# Patient Record
Sex: Female | Born: 1945 | Race: White | Hispanic: No | Smoking: Never smoker
Health system: Southern US, Community
[De-identification: ages and names within clinical notes are randomized; demographics above are authoritative.]

## PROBLEM LIST (undated history)

## (undated) DIAGNOSIS — I10 Essential (primary) hypertension: Secondary | ICD-10-CM

---

## 1999-08-23 ENCOUNTER — Other Ambulatory Visit: Admission: RE | Admit: 1999-08-23 | Discharge: 1999-08-23 | Payer: Self-pay | Admitting: Obstetrics and Gynecology

## 2000-08-10 ENCOUNTER — Other Ambulatory Visit: Admission: RE | Admit: 2000-08-10 | Discharge: 2000-08-10 | Payer: Self-pay | Admitting: Obstetrics and Gynecology

## 2001-08-31 ENCOUNTER — Other Ambulatory Visit: Admission: RE | Admit: 2001-08-31 | Discharge: 2001-08-31 | Payer: Self-pay | Admitting: Obstetrics and Gynecology

## 2002-09-20 ENCOUNTER — Other Ambulatory Visit: Admission: RE | Admit: 2002-09-20 | Discharge: 2002-09-20 | Payer: Self-pay | Admitting: Obstetrics and Gynecology

## 2003-11-03 ENCOUNTER — Other Ambulatory Visit: Admission: RE | Admit: 2003-11-03 | Discharge: 2003-11-03 | Payer: Self-pay | Admitting: Obstetrics and Gynecology

## 2004-11-25 ENCOUNTER — Other Ambulatory Visit: Admission: RE | Admit: 2004-11-25 | Discharge: 2004-11-25 | Payer: Self-pay | Admitting: Obstetrics and Gynecology

## 2006-01-26 ENCOUNTER — Other Ambulatory Visit: Admission: RE | Admit: 2006-01-26 | Discharge: 2006-01-26 | Payer: Self-pay | Admitting: Obstetrics and Gynecology

## 2007-10-12 ENCOUNTER — Encounter: Admission: RE | Admit: 2007-10-12 | Discharge: 2007-10-12 | Payer: Self-pay | Admitting: Family Medicine

## 2015-05-31 ENCOUNTER — Other Ambulatory Visit (HOSPITAL_COMMUNITY): Payer: Self-pay | Admitting: Gastroenterology

## 2015-05-31 DIAGNOSIS — Z1211 Encounter for screening for malignant neoplasm of colon: Secondary | ICD-10-CM

## 2015-06-01 ENCOUNTER — Ambulatory Visit (HOSPITAL_COMMUNITY)
Admission: RE | Admit: 2015-06-01 | Discharge: 2015-06-01 | Disposition: A | Discharge status: Home / Self Care | Payer: Medicare Other | Source: Ambulatory Visit | Attending: Gastroenterology | Admitting: Gastroenterology

## 2015-06-01 DIAGNOSIS — Z1211 Encounter for screening for malignant neoplasm of colon: Secondary | ICD-10-CM | POA: Insufficient documentation

## 2015-06-01 DIAGNOSIS — K5730 Diverticulosis of large intestine without perforation or abscess without bleeding: Secondary | ICD-10-CM | POA: Diagnosis not present

## 2016-01-28 DIAGNOSIS — Z1231 Encounter for screening mammogram for malignant neoplasm of breast: Secondary | ICD-10-CM | POA: Diagnosis not present

## 2016-01-28 DIAGNOSIS — Z803 Family history of malignant neoplasm of breast: Secondary | ICD-10-CM | POA: Diagnosis not present

## 2016-01-30 DIAGNOSIS — B029 Zoster without complications: Secondary | ICD-10-CM | POA: Diagnosis not present

## 2016-02-22 DIAGNOSIS — R05 Cough: Secondary | ICD-10-CM | POA: Diagnosis not present

## 2016-06-02 DIAGNOSIS — R69 Illness, unspecified: Secondary | ICD-10-CM | POA: Diagnosis not present

## 2016-06-02 DIAGNOSIS — K219 Gastro-esophageal reflux disease without esophagitis: Secondary | ICD-10-CM | POA: Diagnosis not present

## 2016-06-02 DIAGNOSIS — I10 Essential (primary) hypertension: Secondary | ICD-10-CM | POA: Diagnosis not present

## 2016-06-02 DIAGNOSIS — E782 Mixed hyperlipidemia: Secondary | ICD-10-CM | POA: Diagnosis not present

## 2016-06-02 DIAGNOSIS — Z0000 Encounter for general adult medical examination without abnormal findings: Secondary | ICD-10-CM | POA: Diagnosis not present

## 2016-08-06 DIAGNOSIS — R69 Illness, unspecified: Secondary | ICD-10-CM | POA: Diagnosis not present

## 2016-09-08 DIAGNOSIS — H16223 Keratoconjunctivitis sicca, not specified as Sjogren's, bilateral: Secondary | ICD-10-CM | POA: Diagnosis not present

## 2016-09-08 DIAGNOSIS — H52223 Regular astigmatism, bilateral: Secondary | ICD-10-CM | POA: Diagnosis not present

## 2016-09-08 DIAGNOSIS — H524 Presbyopia: Secondary | ICD-10-CM | POA: Diagnosis not present

## 2016-09-08 DIAGNOSIS — H5203 Hypermetropia, bilateral: Secondary | ICD-10-CM | POA: Diagnosis not present

## 2016-09-08 DIAGNOSIS — H2513 Age-related nuclear cataract, bilateral: Secondary | ICD-10-CM | POA: Diagnosis not present

## 2016-09-25 DIAGNOSIS — R69 Illness, unspecified: Secondary | ICD-10-CM | POA: Diagnosis not present

## 2016-10-04 DIAGNOSIS — S066X0A Traumatic subarachnoid hemorrhage without loss of consciousness, initial encounter: Secondary | ICD-10-CM | POA: Diagnosis not present

## 2016-10-04 DIAGNOSIS — S065X0A Traumatic subdural hemorrhage without loss of consciousness, initial encounter: Secondary | ICD-10-CM | POA: Diagnosis not present

## 2016-10-05 DIAGNOSIS — S06360A Traumatic hemorrhage of cerebrum, unspecified, without loss of consciousness, initial encounter: Secondary | ICD-10-CM | POA: Diagnosis not present

## 2016-10-05 DIAGNOSIS — S0083XA Contusion of other part of head, initial encounter: Secondary | ICD-10-CM | POA: Diagnosis not present

## 2016-10-05 DIAGNOSIS — Z23 Encounter for immunization: Secondary | ICD-10-CM | POA: Diagnosis not present

## 2016-10-05 DIAGNOSIS — S065X0A Traumatic subdural hemorrhage without loss of consciousness, initial encounter: Secondary | ICD-10-CM | POA: Diagnosis not present

## 2016-10-05 DIAGNOSIS — I10 Essential (primary) hypertension: Secondary | ICD-10-CM | POA: Diagnosis not present

## 2016-10-05 DIAGNOSIS — S065X9A Traumatic subdural hemorrhage with loss of consciousness of unspecified duration, initial encounter: Secondary | ICD-10-CM | POA: Diagnosis not present

## 2016-10-05 DIAGNOSIS — R402411 Glasgow coma scale score 13-15, in the field [EMT or ambulance]: Secondary | ICD-10-CM | POA: Diagnosis not present

## 2016-10-05 DIAGNOSIS — I6200 Nontraumatic subdural hemorrhage, unspecified: Secondary | ICD-10-CM | POA: Diagnosis not present

## 2016-10-05 DIAGNOSIS — S066X0A Traumatic subarachnoid hemorrhage without loss of consciousness, initial encounter: Secondary | ICD-10-CM | POA: Diagnosis not present

## 2016-10-05 DIAGNOSIS — Z7982 Long term (current) use of aspirin: Secondary | ICD-10-CM | POA: Diagnosis not present

## 2016-10-05 DIAGNOSIS — S3993XA Unspecified injury of pelvis, initial encounter: Secondary | ICD-10-CM | POA: Diagnosis not present

## 2016-10-05 DIAGNOSIS — S065X1A Traumatic subdural hemorrhage with loss of consciousness of 30 minutes or less, initial encounter: Secondary | ICD-10-CM | POA: Diagnosis not present

## 2016-10-05 DIAGNOSIS — S01511A Laceration without foreign body of lip, initial encounter: Secondary | ICD-10-CM | POA: Diagnosis not present

## 2016-10-05 DIAGNOSIS — S066X9A Traumatic subarachnoid hemorrhage with loss of consciousness of unspecified duration, initial encounter: Secondary | ICD-10-CM | POA: Diagnosis not present

## 2016-10-05 DIAGNOSIS — S299XXA Unspecified injury of thorax, initial encounter: Secondary | ICD-10-CM | POA: Diagnosis not present

## 2016-10-05 DIAGNOSIS — S199XXA Unspecified injury of neck, initial encounter: Secondary | ICD-10-CM | POA: Diagnosis not present

## 2016-10-05 DIAGNOSIS — S0993XA Unspecified injury of face, initial encounter: Secondary | ICD-10-CM | POA: Diagnosis not present

## 2016-10-05 DIAGNOSIS — S3991XA Unspecified injury of abdomen, initial encounter: Secondary | ICD-10-CM | POA: Diagnosis not present

## 2016-10-07 DIAGNOSIS — S066X0A Traumatic subarachnoid hemorrhage without loss of consciousness, initial encounter: Secondary | ICD-10-CM | POA: Diagnosis not present

## 2016-10-07 DIAGNOSIS — S01511A Laceration without foreign body of lip, initial encounter: Secondary | ICD-10-CM | POA: Diagnosis not present

## 2016-10-07 DIAGNOSIS — S065X0A Traumatic subdural hemorrhage without loss of consciousness, initial encounter: Secondary | ICD-10-CM | POA: Diagnosis not present

## 2016-10-14 DIAGNOSIS — I6200 Nontraumatic subdural hemorrhage, unspecified: Secondary | ICD-10-CM | POA: Diagnosis not present

## 2016-10-15 ENCOUNTER — Other Ambulatory Visit: Payer: Self-pay | Admitting: Neurosurgery

## 2016-10-15 DIAGNOSIS — S065XAA Traumatic subdural hemorrhage with loss of consciousness status unknown, initial encounter: Secondary | ICD-10-CM

## 2016-10-15 DIAGNOSIS — S065X9A Traumatic subdural hemorrhage with loss of consciousness of unspecified duration, initial encounter: Secondary | ICD-10-CM

## 2016-10-16 ENCOUNTER — Ambulatory Visit
Admission: RE | Admit: 2016-10-16 | Discharge: 2016-10-16 | Disposition: A | Discharge status: Home / Self Care | Payer: Medicare HMO | Source: Ambulatory Visit | Attending: Neurosurgery | Admitting: Neurosurgery

## 2016-10-16 DIAGNOSIS — S065XAA Traumatic subdural hemorrhage with loss of consciousness status unknown, initial encounter: Secondary | ICD-10-CM

## 2016-10-16 DIAGNOSIS — S065X9A Traumatic subdural hemorrhage with loss of consciousness of unspecified duration, initial encounter: Secondary | ICD-10-CM

## 2016-10-16 DIAGNOSIS — I6200 Nontraumatic subdural hemorrhage, unspecified: Secondary | ICD-10-CM | POA: Diagnosis not present

## 2016-10-22 ENCOUNTER — Ambulatory Visit: Payer: Medicare Other | Admitting: Diagnostic Neuroimaging

## 2016-10-22 ENCOUNTER — Ambulatory Visit: Payer: Medicare Other | Admitting: Neurology

## 2016-11-12 DIAGNOSIS — H43812 Vitreous degeneration, left eye: Secondary | ICD-10-CM | POA: Diagnosis not present

## 2016-11-12 DIAGNOSIS — S0012XA Contusion of left eyelid and periocular area, initial encounter: Secondary | ICD-10-CM | POA: Diagnosis not present

## 2016-11-12 DIAGNOSIS — H538 Other visual disturbances: Secondary | ICD-10-CM | POA: Diagnosis not present

## 2016-11-12 DIAGNOSIS — H2513 Age-related nuclear cataract, bilateral: Secondary | ICD-10-CM | POA: Diagnosis not present

## 2016-11-12 DIAGNOSIS — H16223 Keratoconjunctivitis sicca, not specified as Sjogren's, bilateral: Secondary | ICD-10-CM | POA: Diagnosis not present

## 2017-01-29 DIAGNOSIS — Z803 Family history of malignant neoplasm of breast: Secondary | ICD-10-CM | POA: Diagnosis not present

## 2017-01-29 DIAGNOSIS — Z1231 Encounter for screening mammogram for malignant neoplasm of breast: Secondary | ICD-10-CM | POA: Diagnosis not present

## 2017-02-02 DIAGNOSIS — R928 Other abnormal and inconclusive findings on diagnostic imaging of breast: Secondary | ICD-10-CM | POA: Diagnosis not present

## 2017-02-02 DIAGNOSIS — Z803 Family history of malignant neoplasm of breast: Secondary | ICD-10-CM | POA: Diagnosis not present

## 2017-02-02 DIAGNOSIS — R922 Inconclusive mammogram: Secondary | ICD-10-CM | POA: Diagnosis not present

## 2017-02-06 DIAGNOSIS — D692 Other nonthrombocytopenic purpura: Secondary | ICD-10-CM | POA: Diagnosis not present

## 2017-02-06 DIAGNOSIS — L821 Other seborrheic keratosis: Secondary | ICD-10-CM | POA: Diagnosis not present

## 2017-02-06 DIAGNOSIS — L738 Other specified follicular disorders: Secondary | ICD-10-CM | POA: Diagnosis not present

## 2017-02-06 DIAGNOSIS — L820 Inflamed seborrheic keratosis: Secondary | ICD-10-CM | POA: Diagnosis not present

## 2017-02-06 DIAGNOSIS — D2272 Melanocytic nevi of left lower limb, including hip: Secondary | ICD-10-CM | POA: Diagnosis not present

## 2017-02-06 DIAGNOSIS — D1801 Hemangioma of skin and subcutaneous tissue: Secondary | ICD-10-CM | POA: Diagnosis not present

## 2017-05-13 DIAGNOSIS — H43812 Vitreous degeneration, left eye: Secondary | ICD-10-CM | POA: Diagnosis not present

## 2017-05-13 DIAGNOSIS — H2513 Age-related nuclear cataract, bilateral: Secondary | ICD-10-CM | POA: Diagnosis not present

## 2017-05-13 DIAGNOSIS — H16223 Keratoconjunctivitis sicca, not specified as Sjogren's, bilateral: Secondary | ICD-10-CM | POA: Diagnosis not present

## 2017-05-13 DIAGNOSIS — S0012XA Contusion of left eyelid and periocular area, initial encounter: Secondary | ICD-10-CM | POA: Diagnosis not present

## 2017-07-07 DIAGNOSIS — R69 Illness, unspecified: Secondary | ICD-10-CM | POA: Diagnosis not present

## 2017-07-07 DIAGNOSIS — Z0000 Encounter for general adult medical examination without abnormal findings: Secondary | ICD-10-CM | POA: Diagnosis not present

## 2017-07-07 DIAGNOSIS — K219 Gastro-esophageal reflux disease without esophagitis: Secondary | ICD-10-CM | POA: Diagnosis not present

## 2017-07-07 DIAGNOSIS — I10 Essential (primary) hypertension: Secondary | ICD-10-CM | POA: Diagnosis not present

## 2017-07-07 DIAGNOSIS — Z1159 Encounter for screening for other viral diseases: Secondary | ICD-10-CM | POA: Diagnosis not present

## 2017-07-07 DIAGNOSIS — E782 Mixed hyperlipidemia: Secondary | ICD-10-CM | POA: Diagnosis not present

## 2017-08-05 DIAGNOSIS — R69 Illness, unspecified: Secondary | ICD-10-CM | POA: Diagnosis not present
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
## 2017-09-16 ENCOUNTER — Other Ambulatory Visit: Payer: Self-pay | Admitting: Family Medicine

## 2017-09-16 DIAGNOSIS — Z23 Encounter for immunization: Secondary | ICD-10-CM | POA: Diagnosis not present

## 2017-09-16 DIAGNOSIS — H0589 Other disorders of orbit: Secondary | ICD-10-CM

## 2017-09-17 ENCOUNTER — Other Ambulatory Visit (HOSPITAL_COMMUNITY): Payer: Self-pay | Admitting: Family Medicine

## 2017-09-17 DIAGNOSIS — H0589 Other disorders of orbit: Secondary | ICD-10-CM

## 2017-09-18 ENCOUNTER — Encounter (HOSPITAL_COMMUNITY): Payer: Self-pay

## 2017-09-18 ENCOUNTER — Ambulatory Visit (HOSPITAL_COMMUNITY): Payer: Medicare HMO

## 2017-09-22 ENCOUNTER — Ambulatory Visit (HOSPITAL_COMMUNITY)
Admission: RE | Admit: 2017-09-22 | Discharge: 2017-09-22 | Disposition: A | Discharge status: Home / Self Care | Payer: Medicare HMO | Source: Ambulatory Visit | Attending: Family Medicine | Admitting: Family Medicine

## 2017-09-22 DIAGNOSIS — H0589 Other disorders of orbit: Secondary | ICD-10-CM | POA: Diagnosis not present

## 2017-09-22 DIAGNOSIS — H539 Unspecified visual disturbance: Secondary | ICD-10-CM | POA: Diagnosis not present

## 2017-09-22 MED ORDER — GADOBENATE DIMEGLUMINE 529 MG/ML IV SOLN
20.0000 mL | Freq: Once | INTRAVENOUS | Status: AC | PRN
  Administered 2017-09-22: 16 mL via INTRAVENOUS

## 2018-02-01 DIAGNOSIS — Z803 Family history of malignant neoplasm of breast: Secondary | ICD-10-CM | POA: Diagnosis not present

## 2018-02-01 DIAGNOSIS — Z1231 Encounter for screening mammogram for malignant neoplasm of breast: Secondary | ICD-10-CM | POA: Diagnosis not present

## 2018-02-05 DIAGNOSIS — H16223 Keratoconjunctivitis sicca, not specified as Sjogren's, bilateral: Secondary | ICD-10-CM | POA: Diagnosis not present

## 2018-02-05 DIAGNOSIS — H43812 Vitreous degeneration, left eye: Secondary | ICD-10-CM | POA: Diagnosis not present

## 2018-02-05 DIAGNOSIS — H2513 Age-related nuclear cataract, bilateral: Secondary | ICD-10-CM | POA: Diagnosis not present

## 2018-02-05 DIAGNOSIS — S0012XA Contusion of left eyelid and periocular area, initial encounter: Secondary | ICD-10-CM | POA: Diagnosis not present

## 2018-04-06 DIAGNOSIS — L738 Other specified follicular disorders: Secondary | ICD-10-CM | POA: Diagnosis not present

## 2018-04-06 DIAGNOSIS — D1801 Hemangioma of skin and subcutaneous tissue: Secondary | ICD-10-CM | POA: Diagnosis not present

## 2018-04-06 DIAGNOSIS — L821 Other seborrheic keratosis: Secondary | ICD-10-CM | POA: Diagnosis not present

## 2018-04-06 DIAGNOSIS — D171 Benign lipomatous neoplasm of skin and subcutaneous tissue of trunk: Secondary | ICD-10-CM | POA: Diagnosis not present

## 2018-04-06 DIAGNOSIS — D225 Melanocytic nevi of trunk: Secondary | ICD-10-CM | POA: Diagnosis not present

## 2018-07-01 DIAGNOSIS — Z8249 Family history of ischemic heart disease and other diseases of the circulatory system: Secondary | ICD-10-CM | POA: Diagnosis not present

## 2018-07-01 DIAGNOSIS — I10 Essential (primary) hypertension: Secondary | ICD-10-CM | POA: Diagnosis not present

## 2018-07-01 DIAGNOSIS — R32 Unspecified urinary incontinence: Secondary | ICD-10-CM | POA: Diagnosis not present

## 2018-07-01 DIAGNOSIS — Z885 Allergy status to narcotic agent status: Secondary | ICD-10-CM | POA: Diagnosis not present

## 2018-07-01 DIAGNOSIS — Z833 Family history of diabetes mellitus: Secondary | ICD-10-CM | POA: Diagnosis not present

## 2018-07-01 DIAGNOSIS — Z880 Allergy status to penicillin: Secondary | ICD-10-CM | POA: Diagnosis not present

## 2018-07-01 DIAGNOSIS — R69 Illness, unspecified: Secondary | ICD-10-CM | POA: Diagnosis not present

## 2018-07-01 DIAGNOSIS — Z881 Allergy status to other antibiotic agents status: Secondary | ICD-10-CM | POA: Diagnosis not present

## 2018-07-01 DIAGNOSIS — Z7982 Long term (current) use of aspirin: Secondary | ICD-10-CM | POA: Diagnosis not present

## 2018-07-01 DIAGNOSIS — Z803 Family history of malignant neoplasm of breast: Secondary | ICD-10-CM | POA: Diagnosis not present

## 2018-08-03 DIAGNOSIS — K219 Gastro-esophageal reflux disease without esophagitis: Secondary | ICD-10-CM | POA: Diagnosis not present

## 2018-08-03 DIAGNOSIS — I10 Essential (primary) hypertension: Secondary | ICD-10-CM | POA: Diagnosis not present

## 2018-08-03 DIAGNOSIS — Z0000 Encounter for general adult medical examination without abnormal findings: Secondary | ICD-10-CM | POA: Diagnosis not present

## 2018-08-03 DIAGNOSIS — E782 Mixed hyperlipidemia: Secondary | ICD-10-CM | POA: Diagnosis not present

## 2018-08-03 DIAGNOSIS — R69 Illness, unspecified: Secondary | ICD-10-CM | POA: Diagnosis not present

## 2018-08-11 DIAGNOSIS — R69 Illness, unspecified: Secondary | ICD-10-CM | POA: Diagnosis not present

## 2018-09-08 DIAGNOSIS — Z780 Asymptomatic menopausal state: Secondary | ICD-10-CM | POA: Diagnosis not present

## 2018-09-08 DIAGNOSIS — M8588 Other specified disorders of bone density and structure, other site: Secondary | ICD-10-CM | POA: Diagnosis not present

## 2018-09-20 DIAGNOSIS — R69 Illness, unspecified: Secondary | ICD-10-CM | POA: Diagnosis not present

## 2018-12-14 DIAGNOSIS — B349 Viral infection, unspecified: Secondary | ICD-10-CM | POA: Diagnosis not present

## 2019-02-07 DIAGNOSIS — Z1231 Encounter for screening mammogram for malignant neoplasm of breast: Secondary | ICD-10-CM | POA: Diagnosis not present

## 2019-02-07 DIAGNOSIS — Z803 Family history of malignant neoplasm of breast: Secondary | ICD-10-CM | POA: Diagnosis not present

## 2019-02-08 DIAGNOSIS — H5203 Hypermetropia, bilateral: Secondary | ICD-10-CM | POA: Diagnosis not present

## 2019-02-08 DIAGNOSIS — H52223 Regular astigmatism, bilateral: Secondary | ICD-10-CM | POA: Diagnosis not present

## 2019-02-08 DIAGNOSIS — H524 Presbyopia: Secondary | ICD-10-CM | POA: Diagnosis not present

## 2019-05-09 ENCOUNTER — Emergency Department (HOSPITAL_COMMUNITY): Payer: Medicare HMO

## 2019-05-09 ENCOUNTER — Emergency Department (HOSPITAL_COMMUNITY)
Admission: EM | Admit: 2019-05-09 | Discharge: 2019-05-09 | Disposition: A | Discharge status: Home / Self Care | Payer: Medicare HMO | Attending: Emergency Medicine | Admitting: Emergency Medicine

## 2019-05-09 ENCOUNTER — Encounter (HOSPITAL_COMMUNITY): Payer: Self-pay

## 2019-05-09 ENCOUNTER — Other Ambulatory Visit: Payer: Self-pay

## 2019-05-09 DIAGNOSIS — I10 Essential (primary) hypertension: Secondary | ICD-10-CM | POA: Insufficient documentation

## 2019-05-09 DIAGNOSIS — S0003XA Contusion of scalp, initial encounter: Secondary | ICD-10-CM | POA: Insufficient documentation

## 2019-05-09 DIAGNOSIS — Y929 Unspecified place or not applicable: Secondary | ICD-10-CM | POA: Diagnosis not present

## 2019-05-09 DIAGNOSIS — Y9301 Activity, walking, marching and hiking: Secondary | ICD-10-CM | POA: Insufficient documentation

## 2019-05-09 DIAGNOSIS — Y999 Unspecified external cause status: Secondary | ICD-10-CM | POA: Diagnosis not present

## 2019-05-09 DIAGNOSIS — W19XXXA Unspecified fall, initial encounter: Secondary | ICD-10-CM

## 2019-05-09 DIAGNOSIS — W010XXA Fall on same level from slipping, tripping and stumbling without subsequent striking against object, initial encounter: Secondary | ICD-10-CM | POA: Diagnosis not present

## 2019-05-09 DIAGNOSIS — S199XXA Unspecified injury of neck, initial encounter: Secondary | ICD-10-CM | POA: Diagnosis not present

## 2019-05-09 DIAGNOSIS — S6991XA Unspecified injury of right wrist, hand and finger(s), initial encounter: Secondary | ICD-10-CM | POA: Diagnosis not present

## 2019-05-09 DIAGNOSIS — S0990XA Unspecified injury of head, initial encounter: Secondary | ICD-10-CM | POA: Diagnosis not present

## 2019-05-09 DIAGNOSIS — S59911A Unspecified injury of right forearm, initial encounter: Secondary | ICD-10-CM | POA: Diagnosis not present

## 2019-05-09 DIAGNOSIS — M79631 Pain in right forearm: Secondary | ICD-10-CM | POA: Diagnosis not present

## 2019-05-09 DIAGNOSIS — M25531 Pain in right wrist: Secondary | ICD-10-CM | POA: Diagnosis not present

## 2019-05-09 DIAGNOSIS — M79601 Pain in right arm: Secondary | ICD-10-CM | POA: Diagnosis not present

## 2019-05-09 HISTORY — DX: Essential (primary) hypertension: I10

## 2019-05-09 NOTE — Discharge Instructions (Signed)
The CT scan of your head does not show any bleeding or fracture. The xray of the forearm and wrist look normal. Take Tylenol for pain Wear splint until you are able to follow up with orthopedics. You can take it off for showering Follow up with your doctor

## 2019-05-09 NOTE — ED Provider Notes (Signed)
[facility] EMERGENCY DEPARTMENT Provider Note   CSN: [id number] Arrival date & time: 05/09/19  1655    History   Chief Complaint Chief Complaint  Patient presents with   Fall    HPI Bailey Rojas is a 73 y.o. female who presents with a fall, head injury, right arm pain.  Past medical history significant for hypertension.  Patient states that she tripped on concrete earlier today when she was carrying groceries.  She fell onto her side and hit the front of her head.  She denies any significant headache, dizziness, nausea, vomiting, vision changes, unilateral weakness.  She is not on blood thinners.  She states that her right forearm is hurting and it radiates up to her shoulder.  She is also has some mild neck pain and pain over the front of her head.  She denies chest pain, shortness of breath, abdominal pain, lower extremity pain.     HPI  Past Medical History:  Diagnosis Date   Hypertension     There are no active problems to display for this patient.   History reviewed. No pertinent surgical history.   OB History   No obstetric history on file.      Home Medications    Prior to Admission medications   Not on File    Family History No family history on file.  Social History Social History   Tobacco Use   Smoking status: Not on file  Substance Use Topics   Alcohol use: Not on file   Drug use: Not on file     Allergies   Patient has no known allergies.   Review of Systems Review of Systems  Respiratory: Negative for shortness of breath.   Cardiovascular: Negative for chest pain.  Gastrointestinal: Negative for abdominal pain.  Musculoskeletal: Positive for arthralgias and neck pain.  Neurological: Negative for dizziness, syncope, weakness and headaches.  All other systems reviewed and are negative.    Physical Exam Updated Vital Signs BP (!) 143/78 (BP Location: Left Arm)    Pulse 91    Temp 98.4 F (36.9 C) (Oral)     Resp 14    SpO2 96%   Physical Exam Vitals signs and nursing note reviewed.  Constitutional:      General: She is not in acute distress.    Appearance: Normal appearance. She is well-developed. She is not ill-appearing.     Comments: Calm, cooperative.  Pleasant  HENT:     Head: Normocephalic.     Comments: Large hematoma over the forehead Eyes:     General: No scleral icterus.       Right eye: No discharge.        Left eye: No discharge.     Conjunctiva/sclera: Conjunctivae normal.     Pupils: Pupils are equal, round, and reactive to light.  Neck:     Musculoskeletal: Normal range of motion.  Cardiovascular:     Rate and Rhythm: Normal rate and regular rhythm.  Pulmonary:     Effort: Pulmonary effort is normal. No respiratory distress.     Breath sounds: Normal breath sounds.  Abdominal:     General: There is no distension.  Musculoskeletal:     Comments: Right upper extremity: Full range of motion no tenderness of the shoulder.  Full range of motion and no tenderness of the elbow.  Mild tenderness over the distal forearm.  No obvious bruising or deformity.  2+ radial pulse.  Tenderness over the anatomical  snuffbox.  Normal grip strength.  Skin:    General: Skin is warm and dry.  Neurological:     Mental Status: She is alert and oriented to person, place, and time.  Psychiatric:        Behavior: Behavior normal. Behavior is cooperative.      ED Treatments / Results  Labs (all labs ordered are listed, but only abnormal results are displayed) Labs Reviewed - No data to display  EKG None  Radiology Dg Forearm Right  Result Date: 05/09/2019 CLINICAL DATA:  Pain following fall EXAM: RIGHT FOREARM - 2 VIEW COMPARISON:  None. FINDINGS: Frontal and lateral views were obtained. There is no demonstrable fracture or dislocation. No elbow joint effusion. The joint spaces appear unremarkable. There is a minus ulnar variance. IMPRESSION: No fracture or dislocation. No appreciable  arthropathy. There is a minus ulnar variance. Electronically Signed   By: Lowella Grip III M.D.   On: 05/09/2019 18:28   Dg Wrist Complete Right  Result Date: 05/09/2019 CLINICAL DATA:  Pain EXAM: RIGHT WRIST - COMPLETE 3+ VIEW COMPARISON:  None. FINDINGS: There is no evidence of fracture or dislocation. There is no evidence of arthropathy or other focal bone abnormality. Soft tissues are unremarkable. IMPRESSION: Negative. Electronically Signed   By: Constance Holster M.D.   On: 05/09/2019 19:39   Ct Head Wo Contrast  Result Date: 05/09/2019 CLINICAL DATA:  Fall, head trauma EXAM: CT HEAD WITHOUT CONTRAST CT CERVICAL SPINE WITHOUT CONTRAST TECHNIQUE: Multidetector CT imaging of the head and cervical spine was performed following the standard protocol without intravenous contrast. Multiplanar CT image reconstructions of the cervical spine were also generated. COMPARISON:  MRI orbit 09/22/2017 FINDINGS: CT HEAD FINDINGS Brain: Mild atrophy. Mild patchy white matter hypodensity bilaterally. Negative for intracranial hemorrhage or mass. Relatively large hypodensity throughout much of the central medulla likely is streak artifact. Correlate with any symptoms of acute brainstem infarct. This area was not imaged on the prior MRI of the orbit. Vascular: Negative for hyperdense vessel Skull: Negative for skull fracture.  Right frontal scalp hematoma Sinuses/Orbits: Negative Other: None CT CERVICAL SPINE FINDINGS Alignment: Normal Skull base and vertebrae: Negative for fracture Soft tissues and spinal canal: Negative for mass or adenopathy. Disc levels: Mild disc degeneration and spurring at C4-5. Small central disc protrusion C4-5. No significant spinal stenosis. Upper chest: Negative Other: None IMPRESSION: 1. Right frontal scalp hematoma. Negative for skull fracture. No acute intracranial hemorrhage. 2. Relatively large hypodensity throughout the medulla most likely streak artifact. Correlate with symptoms  of acute brainstem infarct. 3. Mild cervical spine degenerative change.  Negative for fracture. Electronically Signed   By: Franchot Gallo M.D.   On: 05/09/2019 19:29   Ct Cervical Spine Wo Contrast  Result Date: 05/09/2019 CLINICAL DATA:  Fall, head trauma EXAM: CT HEAD WITHOUT CONTRAST CT CERVICAL SPINE WITHOUT CONTRAST TECHNIQUE: Multidetector CT imaging of the head and cervical spine was performed following the standard protocol without intravenous contrast. Multiplanar CT image reconstructions of the cervical spine were also generated. COMPARISON:  MRI orbit 09/22/2017 FINDINGS: CT HEAD FINDINGS Brain: Mild atrophy. Mild patchy white matter hypodensity bilaterally. Negative for intracranial hemorrhage or mass. Relatively large hypodensity throughout much of the central medulla likely is streak artifact. Correlate with any symptoms of acute brainstem infarct. This area was not imaged on the prior MRI of the orbit. Vascular: Negative for hyperdense vessel Skull: Negative for skull fracture.  Right frontal scalp hematoma Sinuses/Orbits: Negative Other: None CT CERVICAL SPINE FINDINGS  Alignment: Normal Skull base and vertebrae: Negative for fracture Soft tissues and spinal canal: Negative for mass or adenopathy. Disc levels: Mild disc degeneration and spurring at C4-5. Small central disc protrusion C4-5. No significant spinal stenosis. Upper chest: Negative Other: None IMPRESSION: 1. Right frontal scalp hematoma. Negative for skull fracture. No acute intracranial hemorrhage. 2. Relatively large hypodensity throughout the medulla most likely streak artifact. Correlate with symptoms of acute brainstem infarct. 3. Mild cervical spine degenerative change.  Negative for fracture. Electronically Signed   By: Franchot Gallo M.D.   On: 05/09/2019 19:29    Procedures Procedures (including critical care time)  Medications Ordered in ED Medications - No data to display   Initial Impression / Assessment and Plan  / ED Course  I have reviewed the triage vital signs and the nursing notes.  Pertinent labs & imaging results that were available during my care of the patient were reviewed by me and considered in my medical decision making (see chart for details).   74 year old female with mechanical fall earlier today. She has a large hematoma and tenderness over the anatomical snuffbox. She is mildly hypertensive but otherwise vitals are normal. Will order CT head, C-spine. Xray of forearm was ordered in triage but will order dedicated wrist. She was offered Tylenol and declined.  CT head comments on streak artifact vs brainstem infarct. She is not symptomatic therefore likely artifact. C-spine is negative. Xray of the forearm and wrist are negative. She was placed in a velcro splint and advised to f/u with ortho. Shared visit with Dr. Tyrone Nine. Return precautions given.  Final Clinical Impressions(s) / ED Diagnoses   Final diagnoses:  Fall, initial encounter  Hematoma of frontal scalp, initial encounter  Injury of right wrist, initial encounter    ED Discharge Orders    None       Recardo Evangelist, PA-C 05/10/19 [place], [place], DO 05/10/19 1511

## 2019-05-09 NOTE — ED Triage Notes (Signed)
Patient complains of right forearm pain and abrasion with mild swelling to scalp after tripping and falling today. States she fell while carrying groceries, No loc

## 2019-05-09 NOTE — ED Notes (Signed)
Pt daughter will be picking pt up

## 2019-05-11 DIAGNOSIS — M25531 Pain in right wrist: Secondary | ICD-10-CM | POA: Diagnosis not present

## 2019-05-16 DIAGNOSIS — D1801 Hemangioma of skin and subcutaneous tissue: Secondary | ICD-10-CM | POA: Diagnosis not present

## 2019-05-16 DIAGNOSIS — L821 Other seborrheic keratosis: Secondary | ICD-10-CM | POA: Diagnosis not present

## 2019-05-16 DIAGNOSIS — D225 Melanocytic nevi of trunk: Secondary | ICD-10-CM | POA: Diagnosis not present

## 2019-06-21 DIAGNOSIS — H2513 Age-related nuclear cataract, bilateral: Secondary | ICD-10-CM | POA: Diagnosis not present

## 2019-06-21 DIAGNOSIS — H16223 Keratoconjunctivitis sicca, not specified as Sjogren's, bilateral: Secondary | ICD-10-CM | POA: Diagnosis not present

## 2019-06-21 DIAGNOSIS — H25013 Cortical age-related cataract, bilateral: Secondary | ICD-10-CM | POA: Diagnosis not present

## 2019-06-21 DIAGNOSIS — H43311 Vitreous membranes and strands, right eye: Secondary | ICD-10-CM | POA: Diagnosis not present

## 2019-07-20 DIAGNOSIS — H43311 Vitreous membranes and strands, right eye: Secondary | ICD-10-CM | POA: Diagnosis not present

## 2019-07-20 DIAGNOSIS — H25013 Cortical age-related cataract, bilateral: Secondary | ICD-10-CM | POA: Diagnosis not present

## 2019-07-20 DIAGNOSIS — H2513 Age-related nuclear cataract, bilateral: Secondary | ICD-10-CM | POA: Diagnosis not present

## 2019-08-10 DIAGNOSIS — R69 Illness, unspecified: Secondary | ICD-10-CM | POA: Diagnosis not present

## 2019-08-29 DIAGNOSIS — Z23 Encounter for immunization: Secondary | ICD-10-CM | POA: Diagnosis not present

## 2019-08-29 DIAGNOSIS — R21 Rash and other nonspecific skin eruption: Secondary | ICD-10-CM | POA: Diagnosis not present

## 2019-09-03 DIAGNOSIS — B029 Zoster without complications: Secondary | ICD-10-CM | POA: Diagnosis not present

## 2019-09-19 DIAGNOSIS — E782 Mixed hyperlipidemia: Secondary | ICD-10-CM | POA: Diagnosis not present

## 2019-09-19 DIAGNOSIS — I10 Essential (primary) hypertension: Secondary | ICD-10-CM | POA: Diagnosis not present

## 2019-09-21 DIAGNOSIS — R7301 Impaired fasting glucose: Secondary | ICD-10-CM | POA: Diagnosis not present

## 2019-09-21 DIAGNOSIS — E782 Mixed hyperlipidemia: Secondary | ICD-10-CM | POA: Diagnosis not present

## 2019-09-21 DIAGNOSIS — Z7189 Other specified counseling: Secondary | ICD-10-CM | POA: Diagnosis not present

## 2019-09-21 DIAGNOSIS — Z0000 Encounter for general adult medical examination without abnormal findings: Secondary | ICD-10-CM | POA: Diagnosis not present

## 2019-09-21 DIAGNOSIS — B029 Zoster without complications: Secondary | ICD-10-CM | POA: Diagnosis not present

## 2019-09-21 DIAGNOSIS — R69 Illness, unspecified: Secondary | ICD-10-CM | POA: Diagnosis not present

## 2019-09-21 DIAGNOSIS — I10 Essential (primary) hypertension: Secondary | ICD-10-CM | POA: Diagnosis not present

## 2019-09-21 DIAGNOSIS — K219 Gastro-esophageal reflux disease without esophagitis: Secondary | ICD-10-CM | POA: Diagnosis not present

## 2019-10-20 DIAGNOSIS — R69 Illness, unspecified: Secondary | ICD-10-CM | POA: Diagnosis not present

## 2019-10-25 DIAGNOSIS — R69 Illness, unspecified: Secondary | ICD-10-CM | POA: Diagnosis not present

## 2019-12-18 DIAGNOSIS — Z20828 Contact with and (suspected) exposure to other viral communicable diseases: Secondary | ICD-10-CM | POA: Diagnosis not present

## 2019-12-18 DIAGNOSIS — J3489 Other specified disorders of nose and nasal sinuses: Secondary | ICD-10-CM | POA: Diagnosis not present

## 2019-12-21 DIAGNOSIS — R69 Illness, unspecified: Secondary | ICD-10-CM | POA: Diagnosis not present

## 2020-01-10 ENCOUNTER — Ambulatory Visit: Payer: Medicare HMO

## 2020-01-19 ENCOUNTER — Ambulatory Visit: Payer: Medicare HMO | Attending: Internal Medicine

## 2020-01-19 DIAGNOSIS — Z23 Encounter for immunization: Secondary | ICD-10-CM

## 2020-01-19 NOTE — Progress Notes (Signed)
   Covid-19 Vaccination Clinic  Name:  Bailey Rojas    MRN: [id number] DOB: January 11, 1946  01/19/2020  Bailey Rojas was observed post Covid-19 immunization for 30 minutes based on pre-vaccination screening without incidence. She was provided with Vaccine Information Sheet and instruction to access the V-Safe system.   Bailey Rojas was instructed to call 911 with any severe reactions post vaccine: Marland Kitchen Difficulty breathing  . Swelling of your face and throat  . A fast heartbeat  . A bad rash all over your body  . Dizziness and weakness    Immunizations Administered    Name Date Dose VIS Date Route   Pfizer COVID-19 Vaccine 01/19/2020 10:55 AM 0.3 mL 11/25/2019 Intramuscular   Manufacturer: [place]   Lot: [id number]   [place]: [id number]

## 2020-02-13 DIAGNOSIS — Z1231 Encounter for screening mammogram for malignant neoplasm of breast: Secondary | ICD-10-CM | POA: Diagnosis not present

## 2020-02-15 ENCOUNTER — Ambulatory Visit: Payer: Medicare HMO | Attending: Internal Medicine

## 2020-02-15 DIAGNOSIS — Z23 Encounter for immunization: Secondary | ICD-10-CM

## 2020-02-15 NOTE — Progress Notes (Signed)
   Covid-19 Vaccination Clinic  Name:  Bailey Rojas    MRN: [id number] DOB: 02-24-1946  02/15/2020  [name] was observed post Covid-19 immunization for 30 minutes based on pre-vaccination screening without incident. She was provided with Vaccine Information Sheet and instruction to access the V-Safe system.   [name] was instructed to call 911 with any severe reactions post vaccine: Marland Kitchen Difficulty breathing  . Swelling of face and throat  . A fast heartbeat  . A bad rash all over body  . Dizziness and weakness   Immunizations Administered    Name Date Dose VIS Date Route   Pfizer COVID-19 Vaccine 02/15/2020 10:45 AM 0.3 mL 11/25/2019 Intramuscular   Manufacturer: [place]   Lot: [id number]   [place]: [id number]

## 2020-02-17 DIAGNOSIS — R922 Inconclusive mammogram: Secondary | ICD-10-CM | POA: Diagnosis not present

## 2020-02-28 DIAGNOSIS — H52223 Regular astigmatism, bilateral: Secondary | ICD-10-CM | POA: Diagnosis not present

## 2020-02-28 DIAGNOSIS — H524 Presbyopia: Secondary | ICD-10-CM | POA: Diagnosis not present

## 2020-02-28 DIAGNOSIS — H5203 Hypermetropia, bilateral: Secondary | ICD-10-CM | POA: Diagnosis not present

## 2020-04-04 ENCOUNTER — Other Ambulatory Visit: Payer: Self-pay | Admitting: Family Medicine

## 2020-04-04 DIAGNOSIS — R1011 Right upper quadrant pain: Secondary | ICD-10-CM

## 2020-04-09 ENCOUNTER — Ambulatory Visit
Admission: RE | Admit: 2020-04-09 | Discharge: 2020-04-09 | Disposition: A | Discharge status: Home / Self Care | Payer: Medicare HMO | Source: Ambulatory Visit | Attending: Family Medicine | Admitting: Family Medicine

## 2020-04-09 DIAGNOSIS — R1011 Right upper quadrant pain: Secondary | ICD-10-CM

## 2020-04-09 DIAGNOSIS — K760 Fatty (change of) liver, not elsewhere classified: Secondary | ICD-10-CM | POA: Diagnosis not present

## 2020-05-21 DIAGNOSIS — K279 Peptic ulcer, site unspecified, unspecified as acute or chronic, without hemorrhage or perforation: Secondary | ICD-10-CM | POA: Diagnosis not present

## 2020-05-25 DIAGNOSIS — R197 Diarrhea, unspecified: Secondary | ICD-10-CM | POA: Diagnosis not present

## 2020-05-25 DIAGNOSIS — A048 Other specified bacterial intestinal infections: Secondary | ICD-10-CM | POA: Diagnosis not present

## 2020-05-25 DIAGNOSIS — Z1211 Encounter for screening for malignant neoplasm of colon: Secondary | ICD-10-CM | POA: Diagnosis not present

## 2020-06-06 ENCOUNTER — Other Ambulatory Visit: Payer: Self-pay | Admitting: Gastroenterology

## 2020-06-06 DIAGNOSIS — Z1211 Encounter for screening for malignant neoplasm of colon: Secondary | ICD-10-CM

## 2020-06-06 DIAGNOSIS — K5790 Diverticulosis of intestine, part unspecified, without perforation or abscess without bleeding: Secondary | ICD-10-CM

## 2020-06-12 DIAGNOSIS — D2372 Other benign neoplasm of skin of left lower limb, including hip: Secondary | ICD-10-CM | POA: Diagnosis not present

## 2020-06-12 DIAGNOSIS — D485 Neoplasm of uncertain behavior of skin: Secondary | ICD-10-CM | POA: Diagnosis not present

## 2020-06-12 DIAGNOSIS — C4441 Basal cell carcinoma of skin of scalp and neck: Secondary | ICD-10-CM | POA: Diagnosis not present

## 2020-06-12 DIAGNOSIS — L821 Other seborrheic keratosis: Secondary | ICD-10-CM | POA: Diagnosis not present

## 2020-06-12 DIAGNOSIS — D2272 Melanocytic nevi of left lower limb, including hip: Secondary | ICD-10-CM | POA: Diagnosis not present

## 2020-06-12 DIAGNOSIS — L738 Other specified follicular disorders: Secondary | ICD-10-CM | POA: Diagnosis not present

## 2020-06-14 ENCOUNTER — Ambulatory Visit
Admission: RE | Admit: 2020-06-14 | Discharge: 2020-06-14 | Disposition: A | Discharge status: Home / Self Care | Payer: Medicare HMO | Source: Ambulatory Visit | Attending: Gastroenterology | Admitting: Gastroenterology

## 2020-06-14 DIAGNOSIS — K219 Gastro-esophageal reflux disease without esophagitis: Secondary | ICD-10-CM | POA: Diagnosis not present

## 2020-06-14 DIAGNOSIS — Z1211 Encounter for screening for malignant neoplasm of colon: Secondary | ICD-10-CM

## 2020-06-14 DIAGNOSIS — K5790 Diverticulosis of intestine, part unspecified, without perforation or abscess without bleeding: Secondary | ICD-10-CM

## 2020-07-04 DIAGNOSIS — G43B0 Ophthalmoplegic migraine, not intractable: Secondary | ICD-10-CM | POA: Diagnosis not present

## 2020-07-04 DIAGNOSIS — H2513 Age-related nuclear cataract, bilateral: Secondary | ICD-10-CM | POA: Diagnosis not present

## 2020-07-04 DIAGNOSIS — H25013 Cortical age-related cataract, bilateral: Secondary | ICD-10-CM | POA: Diagnosis not present

## 2020-08-08 DIAGNOSIS — R69 Illness, unspecified: Secondary | ICD-10-CM | POA: Diagnosis not present

## 2020-09-26 DIAGNOSIS — K5790 Diverticulosis of intestine, part unspecified, without perforation or abscess without bleeding: Secondary | ICD-10-CM | POA: Diagnosis not present

## 2020-09-26 DIAGNOSIS — I10 Essential (primary) hypertension: Secondary | ICD-10-CM | POA: Diagnosis not present

## 2020-09-26 DIAGNOSIS — R7301 Impaired fasting glucose: Secondary | ICD-10-CM | POA: Diagnosis not present

## 2020-09-26 DIAGNOSIS — Z0000 Encounter for general adult medical examination without abnormal findings: Secondary | ICD-10-CM | POA: Diagnosis not present

## 2020-09-26 DIAGNOSIS — Z7189 Other specified counseling: Secondary | ICD-10-CM | POA: Diagnosis not present

## 2020-09-26 DIAGNOSIS — R69 Illness, unspecified: Secondary | ICD-10-CM | POA: Diagnosis not present

## 2020-09-26 DIAGNOSIS — M8580 Other specified disorders of bone density and structure, unspecified site: Secondary | ICD-10-CM | POA: Diagnosis not present

## 2020-09-26 DIAGNOSIS — E782 Mixed hyperlipidemia: Secondary | ICD-10-CM | POA: Diagnosis not present

## 2020-10-01 DIAGNOSIS — Z20822 Contact with and (suspected) exposure to covid-19: Secondary | ICD-10-CM | POA: Diagnosis not present

## 2020-10-09 DIAGNOSIS — Z780 Asymptomatic menopausal state: Secondary | ICD-10-CM | POA: Diagnosis not present

## 2020-10-09 DIAGNOSIS — M85851 Other specified disorders of bone density and structure, right thigh: Secondary | ICD-10-CM | POA: Diagnosis not present

## 2020-10-20 DIAGNOSIS — Z23 Encounter for immunization: Secondary | ICD-10-CM | POA: Diagnosis not present

## 2020-10-29 DIAGNOSIS — R69 Illness, unspecified: Secondary | ICD-10-CM | POA: Diagnosis not present

## 2020-10-30 DIAGNOSIS — M25562 Pain in left knee: Secondary | ICD-10-CM | POA: Diagnosis not present

## 2020-11-24 DIAGNOSIS — M25562 Pain in left knee: Secondary | ICD-10-CM | POA: Diagnosis not present

## 2020-11-29 DIAGNOSIS — M1712 Unilateral primary osteoarthritis, left knee: Secondary | ICD-10-CM | POA: Diagnosis not present

## 2020-11-29 DIAGNOSIS — M25562 Pain in left knee: Secondary | ICD-10-CM | POA: Diagnosis not present

## 2021-02-18 DIAGNOSIS — Z1231 Encounter for screening mammogram for malignant neoplasm of breast: Secondary | ICD-10-CM | POA: Diagnosis not present

## 2021-02-18 DIAGNOSIS — Z803 Family history of malignant neoplasm of breast: Secondary | ICD-10-CM | POA: Diagnosis not present

## 2021-02-27 DIAGNOSIS — Z0100 Encounter for examination of eyes and vision without abnormal findings: Secondary | ICD-10-CM | POA: Diagnosis not present

## 2021-02-27 DIAGNOSIS — H524 Presbyopia: Secondary | ICD-10-CM | POA: Diagnosis not present

## 2021-03-06 DIAGNOSIS — H2513 Age-related nuclear cataract, bilateral: Secondary | ICD-10-CM | POA: Diagnosis not present

## 2021-03-06 DIAGNOSIS — H16223 Keratoconjunctivitis sicca, not specified as Sjogren's, bilateral: Secondary | ICD-10-CM | POA: Diagnosis not present

## 2021-03-06 DIAGNOSIS — H00015 Hordeolum externum left lower eyelid: Secondary | ICD-10-CM | POA: Diagnosis not present

## 2021-03-06 DIAGNOSIS — H25013 Cortical age-related cataract, bilateral: Secondary | ICD-10-CM | POA: Diagnosis not present

## 2021-06-12 DIAGNOSIS — Z85828 Personal history of other malignant neoplasm of skin: Secondary | ICD-10-CM | POA: Diagnosis not present

## 2021-06-12 DIAGNOSIS — D2272 Melanocytic nevi of left lower limb, including hip: Secondary | ICD-10-CM | POA: Diagnosis not present

## 2021-06-12 DIAGNOSIS — L738 Other specified follicular disorders: Secondary | ICD-10-CM | POA: Diagnosis not present

## 2021-06-12 DIAGNOSIS — D225 Melanocytic nevi of trunk: Secondary | ICD-10-CM | POA: Diagnosis not present

## 2021-06-12 DIAGNOSIS — D2372 Other benign neoplasm of skin of left lower limb, including hip: Secondary | ICD-10-CM | POA: Diagnosis not present

## 2021-06-12 DIAGNOSIS — L821 Other seborrheic keratosis: Secondary | ICD-10-CM | POA: Diagnosis not present

## 2021-10-10 DIAGNOSIS — E782 Mixed hyperlipidemia: Secondary | ICD-10-CM | POA: Diagnosis not present

## 2021-10-10 DIAGNOSIS — Z0000 Encounter for general adult medical examination without abnormal findings: Secondary | ICD-10-CM | POA: Diagnosis not present

## 2021-10-10 DIAGNOSIS — Z23 Encounter for immunization: Secondary | ICD-10-CM | POA: Diagnosis not present

## 2021-10-10 DIAGNOSIS — I10 Essential (primary) hypertension: Secondary | ICD-10-CM | POA: Diagnosis not present

## 2021-10-10 DIAGNOSIS — M8580 Other specified disorders of bone density and structure, unspecified site: Secondary | ICD-10-CM | POA: Diagnosis not present

## 2021-10-10 DIAGNOSIS — K5790 Diverticulosis of intestine, part unspecified, without perforation or abscess without bleeding: Secondary | ICD-10-CM | POA: Diagnosis not present

## 2021-10-10 DIAGNOSIS — K760 Fatty (change of) liver, not elsewhere classified: Secondary | ICD-10-CM | POA: Diagnosis not present

## 2021-10-10 DIAGNOSIS — R7301 Impaired fasting glucose: Secondary | ICD-10-CM | POA: Diagnosis not present

## 2021-10-10 DIAGNOSIS — R69 Illness, unspecified: Secondary | ICD-10-CM | POA: Diagnosis not present

## 2023-12-28 DIAGNOSIS — E782 Mixed hyperlipidemia: Secondary | ICD-10-CM | POA: Diagnosis not present

## 2023-12-28 DIAGNOSIS — F411 Generalized anxiety disorder: Secondary | ICD-10-CM | POA: Diagnosis not present

## 2023-12-28 DIAGNOSIS — I10 Essential (primary) hypertension: Secondary | ICD-10-CM | POA: Diagnosis not present

## 2023-12-28 DIAGNOSIS — R7301 Impaired fasting glucose: Secondary | ICD-10-CM | POA: Diagnosis not present

## 2024-01-05 DIAGNOSIS — K080 Exfoliation of teeth due to systemic causes: Secondary | ICD-10-CM | POA: Diagnosis not present

## 2024-01-17 ENCOUNTER — Other Ambulatory Visit: Payer: Self-pay

## 2024-01-17 ENCOUNTER — Emergency Department (HOSPITAL_COMMUNITY)
Admission: EM | Admit: 2024-01-17 | Discharge: 2024-01-17 | Disposition: A | Discharge status: Home / Self Care | Payer: Medicare Other | Attending: Emergency Medicine | Admitting: Emergency Medicine

## 2024-01-17 ENCOUNTER — Emergency Department (HOSPITAL_COMMUNITY): Payer: Medicare Other

## 2024-01-17 ENCOUNTER — Encounter (HOSPITAL_COMMUNITY): Payer: Self-pay | Admitting: *Deleted

## 2024-01-17 DIAGNOSIS — S0990XA Unspecified injury of head, initial encounter: Secondary | ICD-10-CM

## 2024-01-17 DIAGNOSIS — I6782 Cerebral ischemia: Secondary | ICD-10-CM | POA: Diagnosis not present

## 2024-01-17 DIAGNOSIS — I10 Essential (primary) hypertension: Secondary | ICD-10-CM | POA: Insufficient documentation

## 2024-01-17 DIAGNOSIS — S060X0A Concussion without loss of consciousness, initial encounter: Secondary | ICD-10-CM | POA: Diagnosis not present

## 2024-01-17 DIAGNOSIS — R519 Headache, unspecified: Secondary | ICD-10-CM | POA: Diagnosis not present

## 2024-01-17 DIAGNOSIS — G319 Degenerative disease of nervous system, unspecified: Secondary | ICD-10-CM | POA: Diagnosis not present

## 2024-01-17 DIAGNOSIS — M545 Low back pain, unspecified: Secondary | ICD-10-CM | POA: Diagnosis not present

## 2024-01-17 DIAGNOSIS — W108XXA Fall (on) (from) other stairs and steps, initial encounter: Secondary | ICD-10-CM | POA: Insufficient documentation

## 2024-01-17 DIAGNOSIS — S161XXA Strain of muscle, fascia and tendon at neck level, initial encounter: Secondary | ICD-10-CM | POA: Insufficient documentation

## 2024-01-17 DIAGNOSIS — W19XXXA Unspecified fall, initial encounter: Secondary | ICD-10-CM | POA: Diagnosis not present

## 2024-01-17 MED ORDER — METHOCARBAMOL 500 MG PO TABS: 500.0000 mg | ORAL_TABLET | Freq: Two times a day (BID) | ORAL | 0 refills | Status: AC

## 2024-01-17 NOTE — Discharge Instructions (Addendum)
Please use Tylenol or ibuprofen for pain.  You may use 600 mg ibuprofen every 6 hours or 1000 mg of Tylenol every 6 hours.  You may choose to alternate between the 2.  This would be most effective.  Not to exceed 4 g of Tylenol within 24 hours.  Not to exceed 3200 mg ibuprofen 24 hours.  You can use the muscle relaxant I am prescribing in addition to the above to help with any breakthrough pain.  You can take it up to twice daily.  It is safe to take at night, but I would be cautious taking it during the day as it can cause some drowsiness.  Make sure that you are feeling awake and alert before you get behind the wheel of a car or operate a motor vehicle.  It is not a narcotic pain medication so you are able to take it if it is not making you drowsy and still pilot a vehicle or machinery safely.  If you continue to have persistent neck pain despite medicine as above, and rehab exercises above you can follow-up with the orthopedic physician's contact information as provided.

## 2024-01-17 NOTE — ED Provider Triage Note (Signed)
Emergency Medicine Provider Triage Evaluation Note  Bailey Rojas , a 78 y.o. female  was evaluated in triage.  Pt complains of fall.  States that same occurred on Thursday when she slipped and fell down 3 steps.  She fell first on her buttocks and then hit her head on the wall.  She did not lose consciousness.  She is not anticoagulated.  She notes a persistent headache since the fall, denies vision changes or nausea/vomiting.  Pain is at the base of the right side of her head. Denies sharp shooting pain in her extremities or loss of bowel/bladder function.  Review of Systems  Positive:  Negative:   Physical Exam  BP (!) 153/78 (BP Location: Left Arm)   Pulse 71   Temp 98.4 F (36.9 C) (Oral)   Resp 16   Ht 5\' 7"  (1.702 m)   Wt 77.1 kg   SpO2 97%   BMI 26.63 kg/m  Gen:   Awake, no distress   Resp:  Normal effort  MSK:   Moves extremities without difficulty  Other:  TTP noted the right occipital region. No deformity. No TTP to t or l spine  Medical Decision Making  Medically screening exam initiated at 11:25 AM.  Appropriate orders placed.  Olive Bass was informed that the remainder of the evaluation will be completed by another provider, this initial triage assessment does not replace that evaluation, and the importance of remaining in the ED until their evaluation is complete.     Vear Clock 01/17/24 1129

## 2024-01-17 NOTE — ED Provider Notes (Signed)
[place] EMERGENCY DEPARTMENT AT [facility] Provider Note   CSN: [id number] Arrival date & time: 01/17/24  1030     History  Chief Complaint  Patient presents with   Bailey Rojas is a 78 y.o. female with past medical history significant for hypertension who presents concern for head pain, neck pain, buttocks pain after falling down the stairs a few days ago.  Patient reports that she missed a step and slid down 3 stairs.  She reports that when she hit the landing her neck jerked back and she hit the back of her head.  She reports no loss of consciousness, nausea, numbness, tingling, blurred vision.  She reports tenderness of the neck and the back of the head.  She does not take any blood thinners.  She has been taking Tylenol for pain with some relief.   Fall       Home Medications Prior to Admission medications   Medication Sig Start Date End Date Taking? Authorizing Provider  methocarbamol (ROBAXIN) 500 MG tablet Take 1 tablet (500 mg total) by mouth 2 (two) times daily. 01/17/24  Yes Slayton Lubitz H, PA-C      Allergies    Patient has no known allergies.    Review of Systems   Review of Systems  All other systems reviewed and are negative.   Physical Exam Updated Vital Signs BP (!) 179/81 (BP Location: Right Arm)   Pulse 71   Temp 98.2 F (36.8 C) (Oral)   Resp 18   Ht 5\' 7"  (1.702 m)   Wt 77.1 kg   SpO2 99%   BMI 26.63 kg/m  Physical Exam Vitals and nursing note reviewed.  Constitutional:      General: She is not in acute distress.    Appearance: Normal appearance.  HENT:     Head: Normocephalic and atraumatic.  Eyes:     General:        Right eye: No discharge.        Left eye: No discharge.  Cardiovascular:     Rate and Rhythm: Normal rate and regular rhythm.     Heart sounds: No murmur heard.    No friction rub. No gallop.  Pulmonary:     Effort: Pulmonary effort is normal.     Breath sounds: Normal breath sounds.   Abdominal:     General: Bowel sounds are normal.     Palpations: Abdomen is soft.  Musculoskeletal:     Comments: Patient with some tenderness palpation cervical paraspinous muscles, no midline tenderness, no step-off, deformity.  Also with some mild lumbar paraspinous muscle tenderness, no significant midline tenderness, no step-off, deformity.  Skin:    General: Skin is warm and dry.     Capillary Refill: Capillary refill takes less than 2 seconds.  Neurological:     Mental Status: She is alert and oriented to person, place, and time.     Comments: Cranial nerves II through XII grossly intact.  Intact finger-nose, intact heel-to-shin.  Romberg negative, gait normal.  Alert and oriented x3.  Moves all 4 limbs spontaneously, normal coordination.  No pronator drift.  Intact strength 5 out of 5 bilateral upper and lower extremities.   Psychiatric:        Mood and Affect: Mood normal.        Behavior: Behavior normal.     ED Results / Procedures / Treatments   Labs (all labs ordered are listed, but only abnormal  results are displayed) Labs Reviewed - No data to display  EKG None  Radiology CT Head Wo Contrast Result Date: 01/17/2024 CLINICAL DATA:  78 year old female history of trauma presenting with head and neck pain. EXAM: CT HEAD WITHOUT CONTRAST CT CERVICAL SPINE WITHOUT CONTRAST TECHNIQUE: Multidetector CT imaging of the head and cervical spine was performed following the standard protocol without intravenous contrast. Multiplanar CT image reconstructions of the cervical spine were also generated. RADIATION DOSE REDUCTION: This exam was performed according to the departmental dose-optimization program which includes automated exposure control, adjustment of the mA and/or kV according to patient size and/or use of iterative reconstruction technique. COMPARISON:  Head CT and cervical spine CT 05/09/2019. FINDINGS: CT HEAD FINDINGS Brain: Mild cerebral atrophy. Patchy and confluent  areas of decreased attenuation are noted throughout the deep and periventricular white matter of the cerebral hemispheres bilaterally, compatible with chronic microvascular ischemic disease. No evidence of acute infarction, hemorrhage, hydrocephalus, extra-axial collection or mass lesion/mass effect. Vascular: No hyperdense vessel or unexpected calcification. Skull: Normal. Negative for fracture or focal lesion. Sinuses/Orbits: No acute finding. Other: None. CT CERVICAL SPINE FINDINGS Alignment: Normal. Skull base and vertebrae: No acute fracture. No primary bone lesion or focal pathologic process. Soft tissues and spinal canal: No prevertebral fluid or swelling. No visible canal hematoma. Disc levels: Multilevel degenerative disc disease, most evident at C4-C5 and C6-C7. Mild multilevel facet arthropathy. Upper chest: Unremarkable. Other: None. IMPRESSION: 1. No evidence of significant acute traumatic injury to the skull, brain or cervical spine. 2. Mild cerebral atrophy with chronic microvascular ischemic changes in the cerebral white matter, as above. 3. Multilevel degenerative disc disease and cervical spondylosis, as above. Electronically Signed   By: Trudie Reed M.D.   On: 01/17/2024 12:08   CT Cervical Spine Wo Contrast Result Date: 01/17/2024 CLINICAL DATA:  78 year old female history of trauma presenting with head and neck pain. EXAM: CT HEAD WITHOUT CONTRAST CT CERVICAL SPINE WITHOUT CONTRAST TECHNIQUE: Multidetector CT imaging of the head and cervical spine was performed following the standard protocol without intravenous contrast. Multiplanar CT image reconstructions of the cervical spine were also generated. RADIATION DOSE REDUCTION: This exam was performed according to the departmental dose-optimization program which includes automated exposure control, adjustment of the mA and/or kV according to patient size and/or use of iterative reconstruction technique. COMPARISON:  Head CT and cervical  spine CT 05/09/2019. FINDINGS: CT HEAD FINDINGS Brain: Mild cerebral atrophy. Patchy and confluent areas of decreased attenuation are noted throughout the deep and periventricular white matter of the cerebral hemispheres bilaterally, compatible with chronic microvascular ischemic disease. No evidence of acute infarction, hemorrhage, hydrocephalus, extra-axial collection or mass lesion/mass effect. Vascular: No hyperdense vessel or unexpected calcification. Skull: Normal. Negative for fracture or focal lesion. Sinuses/Orbits: No acute finding. Other: None. CT CERVICAL SPINE FINDINGS Alignment: Normal. Skull base and vertebrae: No acute fracture. No primary bone lesion or focal pathologic process. Soft tissues and spinal canal: No prevertebral fluid or swelling. No visible canal hematoma. Disc levels: Multilevel degenerative disc disease, most evident at C4-C5 and C6-C7. Mild multilevel facet arthropathy. Upper chest: Unremarkable. Other: None. IMPRESSION: 1. No evidence of significant acute traumatic injury to the skull, brain or cervical spine. 2. Mild cerebral atrophy with chronic microvascular ischemic changes in the cerebral white matter, as above. 3. Multilevel degenerative disc disease and cervical spondylosis, as above. Electronically Signed   By: Trudie Reed M.D.   On: 01/17/2024 12:08    Procedures Procedures    Medications Ordered  in ED Medications - No data to display  ED Course/ Medical Decision Making/ A&P                                 Medical Decision Making  This patient is a 78 y.o. female  who presents to the ED for concern of fall, head injury.   Differential diagnoses prior to evaluation: The emergent differential diagnosis includes, but is not limited to,  epidural hematoma, subdural hematoma, skull fracture, subarachnoid hemorrhage, unstable cervical spine fracture, concussion vs other MSK injury  . This is not an exhaustive differential.   Past Medical History /  Co-morbidities / Social History: Hypertension, otherwise noncontributory, does not take blood thinners  Physical Exam: Physical exam performed. The pertinent findings include: Patient with some tenderness palpation cervical paraspinous muscles, no midline tenderness, no step-off, deformity.  Also with some mild lumbar paraspinous muscle tenderness, no significant midline tenderness, no step-off, deformity.   Cranial nerves II through XII grossly intact.  Intact finger-nose, intact heel-to-shin.  Romberg negative, gait normal.  Alert and oriented x3.  Moves all 4 limbs spontaneously, normal coordination.  No pronator drift.  Intact strength 5 out of 5 bilateral upper and lower extremities.    Lab Tests/Imaging studies: I personally interpreted labs/imaging and the pertinent results include: I independently interpreted CT head without contrast, CT C-spine without contrast which showed no evidence of acute intracranial abnormality, no evidence of cervical spine fracture or other acute pathology..I agree with the radiologist interpretation.   Medications: On assessment patient with some ongoing cervical strain, suspect postconcussion syndrome, tension headache, offered migraine cocktail and further pain control in the emergency department, patient declines, will plan to discharge with Robaxin as needed, discussed mild sedating properties but safe to use as long as not bile in a vehicle, and safe at night.  Discussed extensive return precautions.  Patient understands and agrees to plan   Disposition: After consideration of the diagnostic results and the patients response to treatment, I feel that patient with no evidence of acute traumatic injury, she has probable concussion, cervical sprain, strain, discussed rehab and expected recovery course.   emergency department workup does not suggest an emergent condition requiring admission or immediate intervention beyond what has been performed at this  time. The plan is: as above. The patient is safe for discharge and has been instructed to return immediately for worsening symptoms, change in symptoms or any other concerns.  Final Clinical Impression(s) / ED Diagnoses Final diagnoses:  Injury of head, initial encounter  Concussion without loss of consciousness, initial encounter  Strain of neck muscle, initial encounter    Rx / DC Orders ED Discharge Orders          Ordered    methocarbamol (ROBAXIN) 500 MG tablet  2 times daily        01/17/24 1512              Americo Vallery, [place] H, PA-C 01/17/24 1513    Gwyneth Sprout, MD 01/18/24 0000

## 2024-01-17 NOTE — ED Triage Notes (Addendum)
Pt states on Thursday she missed a step and fell down 3 stairs.  She landed on her buttocks, then hit the R side of her head.  Denies nausea, numbness or tingling or blurred vision.  Pt c/o on tenderness on palpation and movement.  Pt is not on blood thinners.

## 2024-01-28 DIAGNOSIS — S139XXA Sprain of joints and ligaments of unspecified parts of neck, initial encounter: Secondary | ICD-10-CM | POA: Diagnosis not present

## 2024-01-28 DIAGNOSIS — S060X9A Concussion with loss of consciousness of unspecified duration, initial encounter: Secondary | ICD-10-CM | POA: Diagnosis not present

## 2024-02-23 DIAGNOSIS — M542 Cervicalgia: Secondary | ICD-10-CM | POA: Diagnosis not present

## 2024-02-23 DIAGNOSIS — M9902 Segmental and somatic dysfunction of thoracic region: Secondary | ICD-10-CM | POA: Diagnosis not present

## 2024-02-23 DIAGNOSIS — M546 Pain in thoracic spine: Secondary | ICD-10-CM | POA: Diagnosis not present

## 2024-02-23 DIAGNOSIS — M9901 Segmental and somatic dysfunction of cervical region: Secondary | ICD-10-CM | POA: Diagnosis not present

## 2024-02-25 DIAGNOSIS — M9901 Segmental and somatic dysfunction of cervical region: Secondary | ICD-10-CM | POA: Diagnosis not present

## 2024-02-25 DIAGNOSIS — M9902 Segmental and somatic dysfunction of thoracic region: Secondary | ICD-10-CM | POA: Diagnosis not present

## 2024-02-25 DIAGNOSIS — M546 Pain in thoracic spine: Secondary | ICD-10-CM | POA: Diagnosis not present

## 2024-02-25 DIAGNOSIS — M542 Cervicalgia: Secondary | ICD-10-CM | POA: Diagnosis not present

## 2024-02-29 DIAGNOSIS — M542 Cervicalgia: Secondary | ICD-10-CM | POA: Diagnosis not present

## 2024-02-29 DIAGNOSIS — M9902 Segmental and somatic dysfunction of thoracic region: Secondary | ICD-10-CM | POA: Diagnosis not present

## 2024-02-29 DIAGNOSIS — M9901 Segmental and somatic dysfunction of cervical region: Secondary | ICD-10-CM | POA: Diagnosis not present

## 2024-02-29 DIAGNOSIS — M546 Pain in thoracic spine: Secondary | ICD-10-CM | POA: Diagnosis not present

## 2024-03-01 DIAGNOSIS — M9901 Segmental and somatic dysfunction of cervical region: Secondary | ICD-10-CM | POA: Diagnosis not present

## 2024-03-01 DIAGNOSIS — M546 Pain in thoracic spine: Secondary | ICD-10-CM | POA: Diagnosis not present

## 2024-03-01 DIAGNOSIS — M542 Cervicalgia: Secondary | ICD-10-CM | POA: Diagnosis not present

## 2024-03-01 DIAGNOSIS — M9902 Segmental and somatic dysfunction of thoracic region: Secondary | ICD-10-CM | POA: Diagnosis not present

## 2024-03-04 DIAGNOSIS — H5203 Hypermetropia, bilateral: Secondary | ICD-10-CM | POA: Diagnosis not present

## 2024-03-07 DIAGNOSIS — M9902 Segmental and somatic dysfunction of thoracic region: Secondary | ICD-10-CM | POA: Diagnosis not present

## 2024-03-07 DIAGNOSIS — M542 Cervicalgia: Secondary | ICD-10-CM | POA: Diagnosis not present

## 2024-03-07 DIAGNOSIS — M546 Pain in thoracic spine: Secondary | ICD-10-CM | POA: Diagnosis not present

## 2024-03-07 DIAGNOSIS — M9901 Segmental and somatic dysfunction of cervical region: Secondary | ICD-10-CM | POA: Diagnosis not present

## 2024-03-10 DIAGNOSIS — M9902 Segmental and somatic dysfunction of thoracic region: Secondary | ICD-10-CM | POA: Diagnosis not present

## 2024-03-10 DIAGNOSIS — M542 Cervicalgia: Secondary | ICD-10-CM | POA: Diagnosis not present

## 2024-03-10 DIAGNOSIS — M546 Pain in thoracic spine: Secondary | ICD-10-CM | POA: Diagnosis not present

## 2024-03-10 DIAGNOSIS — M9901 Segmental and somatic dysfunction of cervical region: Secondary | ICD-10-CM | POA: Diagnosis not present

## 2024-03-14 DIAGNOSIS — M546 Pain in thoracic spine: Secondary | ICD-10-CM | POA: Diagnosis not present

## 2024-03-14 DIAGNOSIS — M542 Cervicalgia: Secondary | ICD-10-CM | POA: Diagnosis not present

## 2024-03-14 DIAGNOSIS — M9901 Segmental and somatic dysfunction of cervical region: Secondary | ICD-10-CM | POA: Diagnosis not present

## 2024-03-14 DIAGNOSIS — M9902 Segmental and somatic dysfunction of thoracic region: Secondary | ICD-10-CM | POA: Diagnosis not present

## 2024-03-15 DIAGNOSIS — M9901 Segmental and somatic dysfunction of cervical region: Secondary | ICD-10-CM | POA: Diagnosis not present

## 2024-03-15 DIAGNOSIS — M546 Pain in thoracic spine: Secondary | ICD-10-CM | POA: Diagnosis not present

## 2024-03-15 DIAGNOSIS — M9902 Segmental and somatic dysfunction of thoracic region: Secondary | ICD-10-CM | POA: Diagnosis not present

## 2024-03-15 DIAGNOSIS — M542 Cervicalgia: Secondary | ICD-10-CM | POA: Diagnosis not present

## 2024-03-16 DIAGNOSIS — M9902 Segmental and somatic dysfunction of thoracic region: Secondary | ICD-10-CM | POA: Diagnosis not present

## 2024-03-16 DIAGNOSIS — M9901 Segmental and somatic dysfunction of cervical region: Secondary | ICD-10-CM | POA: Diagnosis not present

## 2024-03-16 DIAGNOSIS — M542 Cervicalgia: Secondary | ICD-10-CM | POA: Diagnosis not present

## 2024-03-16 DIAGNOSIS — M546 Pain in thoracic spine: Secondary | ICD-10-CM | POA: Diagnosis not present

## 2024-03-17 DIAGNOSIS — M9902 Segmental and somatic dysfunction of thoracic region: Secondary | ICD-10-CM | POA: Diagnosis not present

## 2024-03-17 DIAGNOSIS — M9901 Segmental and somatic dysfunction of cervical region: Secondary | ICD-10-CM | POA: Diagnosis not present

## 2024-03-17 DIAGNOSIS — M546 Pain in thoracic spine: Secondary | ICD-10-CM | POA: Diagnosis not present

## 2024-03-17 DIAGNOSIS — M542 Cervicalgia: Secondary | ICD-10-CM | POA: Diagnosis not present

## 2024-03-21 DIAGNOSIS — M546 Pain in thoracic spine: Secondary | ICD-10-CM | POA: Diagnosis not present

## 2024-03-21 DIAGNOSIS — M542 Cervicalgia: Secondary | ICD-10-CM | POA: Diagnosis not present

## 2024-03-21 DIAGNOSIS — M9901 Segmental and somatic dysfunction of cervical region: Secondary | ICD-10-CM | POA: Diagnosis not present

## 2024-03-21 DIAGNOSIS — M9902 Segmental and somatic dysfunction of thoracic region: Secondary | ICD-10-CM | POA: Diagnosis not present

## 2024-03-23 DIAGNOSIS — M542 Cervicalgia: Secondary | ICD-10-CM | POA: Diagnosis not present

## 2024-03-23 DIAGNOSIS — M546 Pain in thoracic spine: Secondary | ICD-10-CM | POA: Diagnosis not present

## 2024-03-23 DIAGNOSIS — M9901 Segmental and somatic dysfunction of cervical region: Secondary | ICD-10-CM | POA: Diagnosis not present

## 2024-03-23 DIAGNOSIS — M9902 Segmental and somatic dysfunction of thoracic region: Secondary | ICD-10-CM | POA: Diagnosis not present

## 2024-03-28 DIAGNOSIS — M9902 Segmental and somatic dysfunction of thoracic region: Secondary | ICD-10-CM | POA: Diagnosis not present

## 2024-03-28 DIAGNOSIS — M546 Pain in thoracic spine: Secondary | ICD-10-CM | POA: Diagnosis not present

## 2024-03-28 DIAGNOSIS — M542 Cervicalgia: Secondary | ICD-10-CM | POA: Diagnosis not present

## 2024-03-28 DIAGNOSIS — M9901 Segmental and somatic dysfunction of cervical region: Secondary | ICD-10-CM | POA: Diagnosis not present

## 2024-04-04 DIAGNOSIS — M9902 Segmental and somatic dysfunction of thoracic region: Secondary | ICD-10-CM | POA: Diagnosis not present

## 2024-04-04 DIAGNOSIS — M546 Pain in thoracic spine: Secondary | ICD-10-CM | POA: Diagnosis not present

## 2024-04-04 DIAGNOSIS — M542 Cervicalgia: Secondary | ICD-10-CM | POA: Diagnosis not present

## 2024-04-04 DIAGNOSIS — M9901 Segmental and somatic dysfunction of cervical region: Secondary | ICD-10-CM | POA: Diagnosis not present

## 2024-04-08 DIAGNOSIS — R7301 Impaired fasting glucose: Secondary | ICD-10-CM | POA: Diagnosis not present

## 2024-04-08 DIAGNOSIS — I10 Essential (primary) hypertension: Secondary | ICD-10-CM | POA: Diagnosis not present

## 2024-04-08 DIAGNOSIS — E782 Mixed hyperlipidemia: Secondary | ICD-10-CM | POA: Diagnosis not present

## 2024-04-08 DIAGNOSIS — Z0000 Encounter for general adult medical examination without abnormal findings: Secondary | ICD-10-CM | POA: Diagnosis not present

## 2024-04-08 DIAGNOSIS — K760 Fatty (change of) liver, not elsewhere classified: Secondary | ICD-10-CM | POA: Diagnosis not present

## 2024-04-08 DIAGNOSIS — Z1331 Encounter for screening for depression: Secondary | ICD-10-CM | POA: Diagnosis not present

## 2024-04-08 DIAGNOSIS — F411 Generalized anxiety disorder: Secondary | ICD-10-CM | POA: Diagnosis not present

## 2024-04-11 DIAGNOSIS — M546 Pain in thoracic spine: Secondary | ICD-10-CM | POA: Diagnosis not present

## 2024-04-11 DIAGNOSIS — M542 Cervicalgia: Secondary | ICD-10-CM | POA: Diagnosis not present

## 2024-04-11 DIAGNOSIS — M9901 Segmental and somatic dysfunction of cervical region: Secondary | ICD-10-CM | POA: Diagnosis not present

## 2024-04-11 DIAGNOSIS — M9902 Segmental and somatic dysfunction of thoracic region: Secondary | ICD-10-CM | POA: Diagnosis not present

## 2024-04-12 DIAGNOSIS — Z85828 Personal history of other malignant neoplasm of skin: Secondary | ICD-10-CM | POA: Diagnosis not present

## 2024-04-12 DIAGNOSIS — L821 Other seborrheic keratosis: Secondary | ICD-10-CM | POA: Diagnosis not present

## 2024-04-12 DIAGNOSIS — D2272 Melanocytic nevi of left lower limb, including hip: Secondary | ICD-10-CM | POA: Diagnosis not present

## 2024-04-12 DIAGNOSIS — D2372 Other benign neoplasm of skin of left lower limb, including hip: Secondary | ICD-10-CM | POA: Diagnosis not present

## 2024-04-15 DIAGNOSIS — Z1231 Encounter for screening mammogram for malignant neoplasm of breast: Secondary | ICD-10-CM | POA: Diagnosis not present

## 2024-04-15 DIAGNOSIS — M8588 Other specified disorders of bone density and structure, other site: Secondary | ICD-10-CM | POA: Diagnosis not present

## 2024-04-18 DIAGNOSIS — M542 Cervicalgia: Secondary | ICD-10-CM | POA: Diagnosis not present

## 2024-04-18 DIAGNOSIS — M9901 Segmental and somatic dysfunction of cervical region: Secondary | ICD-10-CM | POA: Diagnosis not present

## 2024-04-18 DIAGNOSIS — M9902 Segmental and somatic dysfunction of thoracic region: Secondary | ICD-10-CM | POA: Diagnosis not present

## 2024-04-18 DIAGNOSIS — M546 Pain in thoracic spine: Secondary | ICD-10-CM | POA: Diagnosis not present

## 2024-04-19 DIAGNOSIS — K080 Exfoliation of teeth due to systemic causes: Secondary | ICD-10-CM | POA: Diagnosis not present

## 2024-04-25 DIAGNOSIS — M9901 Segmental and somatic dysfunction of cervical region: Secondary | ICD-10-CM | POA: Diagnosis not present

## 2024-04-25 DIAGNOSIS — M542 Cervicalgia: Secondary | ICD-10-CM | POA: Diagnosis not present

## 2024-04-25 DIAGNOSIS — M9902 Segmental and somatic dysfunction of thoracic region: Secondary | ICD-10-CM | POA: Diagnosis not present

## 2024-04-25 DIAGNOSIS — M546 Pain in thoracic spine: Secondary | ICD-10-CM | POA: Diagnosis not present

## 2024-06-06 DIAGNOSIS — M9905 Segmental and somatic dysfunction of pelvic region: Secondary | ICD-10-CM | POA: Diagnosis not present

## 2024-06-06 DIAGNOSIS — M5030 Other cervical disc degeneration, unspecified cervical region: Secondary | ICD-10-CM | POA: Diagnosis not present

## 2024-06-06 DIAGNOSIS — M9903 Segmental and somatic dysfunction of lumbar region: Secondary | ICD-10-CM | POA: Diagnosis not present

## 2024-06-06 DIAGNOSIS — M9901 Segmental and somatic dysfunction of cervical region: Secondary | ICD-10-CM | POA: Diagnosis not present

## 2024-07-04 DIAGNOSIS — M9901 Segmental and somatic dysfunction of cervical region: Secondary | ICD-10-CM | POA: Diagnosis not present

## 2024-07-04 DIAGNOSIS — M5030 Other cervical disc degeneration, unspecified cervical region: Secondary | ICD-10-CM | POA: Diagnosis not present

## 2024-07-04 DIAGNOSIS — M9905 Segmental and somatic dysfunction of pelvic region: Secondary | ICD-10-CM | POA: Diagnosis not present

## 2024-07-04 DIAGNOSIS — M9903 Segmental and somatic dysfunction of lumbar region: Secondary | ICD-10-CM | POA: Diagnosis not present

## 2024-07-13 DIAGNOSIS — K080 Exfoliation of teeth due to systemic causes: Secondary | ICD-10-CM | POA: Diagnosis not present

## 2024-07-26 DIAGNOSIS — H18413 Arcus senilis, bilateral: Secondary | ICD-10-CM | POA: Diagnosis not present

## 2024-07-26 DIAGNOSIS — H2511 Age-related nuclear cataract, right eye: Secondary | ICD-10-CM | POA: Diagnosis not present

## 2024-07-26 DIAGNOSIS — H2513 Age-related nuclear cataract, bilateral: Secondary | ICD-10-CM | POA: Diagnosis not present

## 2024-07-26 DIAGNOSIS — H47323 Drusen of optic disc, bilateral: Secondary | ICD-10-CM | POA: Diagnosis not present

## 2024-07-26 DIAGNOSIS — H25043 Posterior subcapsular polar age-related cataract, bilateral: Secondary | ICD-10-CM | POA: Diagnosis not present

## 2024-08-01 DIAGNOSIS — M9903 Segmental and somatic dysfunction of lumbar region: Secondary | ICD-10-CM | POA: Diagnosis not present

## 2024-08-01 DIAGNOSIS — M9905 Segmental and somatic dysfunction of pelvic region: Secondary | ICD-10-CM | POA: Diagnosis not present

## 2024-08-01 DIAGNOSIS — M9901 Segmental and somatic dysfunction of cervical region: Secondary | ICD-10-CM | POA: Diagnosis not present

## 2024-08-01 DIAGNOSIS — M5030 Other cervical disc degeneration, unspecified cervical region: Secondary | ICD-10-CM | POA: Diagnosis not present

## 2024-09-05 DIAGNOSIS — M9901 Segmental and somatic dysfunction of cervical region: Secondary | ICD-10-CM | POA: Diagnosis not present

## 2024-09-05 DIAGNOSIS — M9905 Segmental and somatic dysfunction of pelvic region: Secondary | ICD-10-CM | POA: Diagnosis not present

## 2024-09-05 DIAGNOSIS — M5030 Other cervical disc degeneration, unspecified cervical region: Secondary | ICD-10-CM | POA: Diagnosis not present

## 2024-09-05 DIAGNOSIS — M9903 Segmental and somatic dysfunction of lumbar region: Secondary | ICD-10-CM | POA: Diagnosis not present

## 2024-09-07 DIAGNOSIS — L299 Pruritus, unspecified: Secondary | ICD-10-CM | POA: Diagnosis not present

## 2024-09-07 DIAGNOSIS — H6121 Impacted cerumen, right ear: Secondary | ICD-10-CM | POA: Diagnosis not present

## 2024-09-07 DIAGNOSIS — H6122 Impacted cerumen, left ear: Secondary | ICD-10-CM | POA: Diagnosis not present

## 2024-09-07 DIAGNOSIS — H938X3 Other specified disorders of ear, bilateral: Secondary | ICD-10-CM | POA: Diagnosis not present

## 2024-10-01 DIAGNOSIS — Z23 Encounter for immunization: Secondary | ICD-10-CM | POA: Diagnosis not present

## 2024-10-03 DIAGNOSIS — H52209 Unspecified astigmatism, unspecified eye: Secondary | ICD-10-CM | POA: Diagnosis not present

## 2024-10-03 DIAGNOSIS — H2511 Age-related nuclear cataract, right eye: Secondary | ICD-10-CM | POA: Diagnosis not present

## 2024-10-04 DIAGNOSIS — H2512 Age-related nuclear cataract, left eye: Secondary | ICD-10-CM | POA: Diagnosis not present

## 2024-10-10 DIAGNOSIS — M5030 Other cervical disc degeneration, unspecified cervical region: Secondary | ICD-10-CM | POA: Diagnosis not present

## 2024-10-10 DIAGNOSIS — M9901 Segmental and somatic dysfunction of cervical region: Secondary | ICD-10-CM | POA: Diagnosis not present

## 2024-10-10 DIAGNOSIS — M9905 Segmental and somatic dysfunction of pelvic region: Secondary | ICD-10-CM | POA: Diagnosis not present

## 2024-10-10 DIAGNOSIS — M9903 Segmental and somatic dysfunction of lumbar region: Secondary | ICD-10-CM | POA: Diagnosis not present

## 2024-10-17 DIAGNOSIS — H25012 Cortical age-related cataract, left eye: Secondary | ICD-10-CM | POA: Diagnosis not present

## 2024-10-17 DIAGNOSIS — H25042 Posterior subcapsular polar age-related cataract, left eye: Secondary | ICD-10-CM | POA: Diagnosis not present

## 2024-10-17 DIAGNOSIS — H5371 Glare sensitivity: Secondary | ICD-10-CM | POA: Diagnosis not present

## 2024-10-17 DIAGNOSIS — H52202 Unspecified astigmatism, left eye: Secondary | ICD-10-CM | POA: Diagnosis not present

## 2024-10-17 DIAGNOSIS — H2512 Age-related nuclear cataract, left eye: Secondary | ICD-10-CM | POA: Diagnosis not present

## 2024-10-31 DIAGNOSIS — M9905 Segmental and somatic dysfunction of pelvic region: Secondary | ICD-10-CM | POA: Diagnosis not present

## 2024-10-31 DIAGNOSIS — M9903 Segmental and somatic dysfunction of lumbar region: Secondary | ICD-10-CM | POA: Diagnosis not present

## 2024-10-31 DIAGNOSIS — M5030 Other cervical disc degeneration, unspecified cervical region: Secondary | ICD-10-CM | POA: Diagnosis not present

## 2024-10-31 DIAGNOSIS — M9901 Segmental and somatic dysfunction of cervical region: Secondary | ICD-10-CM | POA: Diagnosis not present
